# Patient Record
Sex: Male | Born: 1995 | Race: White | Hispanic: No | Marital: Single | Smoking: Never smoker
Health system: Southern US, Community
[De-identification: ages and names within clinical notes are randomized; demographics above are authoritative.]

## PROBLEM LIST (undated history)

## (undated) DIAGNOSIS — Z8619 Personal history of other infectious and parasitic diseases: Secondary | ICD-10-CM

## (undated) HISTORY — PX: NO PAST SURGERIES: SHX2092

## (undated) HISTORY — DX: Personal history of other infectious and parasitic diseases: Z86.19

---
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
## 2013-01-26 ENCOUNTER — Telehealth: Payer: Self-pay | Admitting: Internal Medicine

## 2013-01-26 NOTE — Telephone Encounter (Signed)
Patient Information:  Caller Name: Darl Pikes  Phone: [phone]  Patient: Paul Myers, Paul Myers  Gender: Male  DOB: [birth year]  Age: 17 Years  PCP: Sanda Linger (Adults only)  Office Follow Up:  Does the office need to follow up with this patient?: No  Instructions For The Office: N/A  RN Note:  Pt is away at school at [employer].  Mother thinks it could be related to stress or anxiety. Mother is wanting to know if patient needs to be seen while at school or if pt can wait to be seen when he comes home in October.  RN advised for pt to be seen where he is at school  Symptoms  Reason For Call & Symptoms: mother reports pt was seen in the office on 12/16/12 by Dr Posey Rea for heart palpitations.  Mother states the symptoms went away but they are starting to come back again.  Mother states pt is following advice from MD but symptoms are continuing.   Reviewed Health History In EMR: Yes  Reviewed Medications In EMR: Yes  Reviewed Allergies In EMR: Yes  Reviewed Surgeries / Procedures: Yes  Date of Onset of Symptoms: 01/21/2013  Guideline(s) Used:  Heart Rate and Heartbeat Questions  Disposition Per Guideline:   See Within 3 Days in Office  Reason For Disposition Reached:   Palpitations and no improvement after following Care Advice  Advice Given:  Health Basics  Sleep: Try to get sufficient amount of sleep. Lack of sleep can aggravate palpitations. Most people need 7-8 hours of sleep each night.  Exercise: Regular exercise will improve your overall health, improve your mood, and is a simple method to reduce stress.  Diet: Eat a balanced healthy diet.  Avoid Caffeine  Avoid caffeine-containing beverages (Reason: caffeine is a stimulant and can aggravate palpitations).  Avoid Diet Pills  : Do not use diet pills (Reason: they act as stimulants).  Limit Alcohol:  Limit your alcohol consumption to no more than 2 drinks a day. Ideally, eliminate alcohol entirely for the next 2 weeks.  Call Back  If:  Chest pain, lightheadedness, or difficulty breathing occurs  Heart beating more than 130 beats / minute  More than 3 extra or skipped beats / minute  You become worse.  Patient Will Follow Care Advice:  YES

## 2013-04-20 ENCOUNTER — Other Ambulatory Visit (INDEPENDENT_AMBULATORY_CARE_PROVIDER_SITE_OTHER): Payer: Managed Care, Other (non HMO)

## 2013-04-20 ENCOUNTER — Ambulatory Visit (INDEPENDENT_AMBULATORY_CARE_PROVIDER_SITE_OTHER): Payer: Managed Care, Other (non HMO) | Admitting: Internal Medicine

## 2013-04-20 ENCOUNTER — Encounter: Payer: Self-pay | Admitting: Internal Medicine

## 2013-04-20 VITALS — BP 118/82 | HR 68 | Temp 97.7°F | Wt 162.8 lb

## 2013-04-20 DIAGNOSIS — Z113 Encounter for screening for infections with a predominantly sexual mode of transmission: Secondary | ICD-10-CM

## 2013-04-20 DIAGNOSIS — N341 Nonspecific urethritis: Secondary | ICD-10-CM

## 2013-04-20 DIAGNOSIS — J069 Acute upper respiratory infection, unspecified: Secondary | ICD-10-CM

## 2013-04-20 LAB — URINALYSIS, ROUTINE W REFLEX MICROSCOPIC
Hgb urine dipstick: NEGATIVE
Leukocytes, UA: NEGATIVE
Nitrite: NEGATIVE
Total Protein, Urine: NEGATIVE
pH: 6.5 (ref 5.0–8.0)

## 2013-04-20 MED ORDER — AZITHROMYCIN 500 MG PO TABS: 1000.0000 mg | ORAL_TABLET | Freq: Once | ORAL | Status: DC

## 2013-04-20 NOTE — Progress Notes (Signed)
Pre-visit discussion using our clinic review tool. No additional management support is needed unless otherwise documented below in the visit note.  

## 2013-04-20 NOTE — Patient Instructions (Addendum)
It was good to see you today.  Test(s) ordered today. Your results will be released to MyChart (or called to you at [phone]) after review, usually within 72hours after test completion. If any changes need to be made, you will be notified at that same time.  antibiotics - Azithromycin - 2pills x 1 dose today - Your prescription(s) have been submitted to your pharmacy. Please take as directed and contact our office if you believe you are having problem(s) with the medication(s).   Urethritis, Adult Urethritis is an inflammation (soreness) of the urethra (the tube exiting from the bladder). It is often caused by germs that may be spread through sexual contact. TREATMENT  Urethritis will usually respond to antibiotics. These are medications that kill germs. Take all the medicine given to you. You may feel better in a couple days, but TAKE ALL MEDICINE or the infection may not be completely cured and may become more difficult to treat. Response can generally be expected in 7 to 10 days. You may require additional treatment after more testing. HOME CARE INSTRUCTIONS  Not have sex until the test results are known and treatment is completed.  Know that you may be asked to notify your sex partner when your final test results are back.  Finish all medications as prescribed.  Prevent sexually transmitted infections including AIDS. Practice safe sex. Use condoms. SEEK MEDICAL CARE IF:   Your symptoms are not improved in 2 to 3 days.  Your symptoms are getting worse.  Your develop abdominal pain.  You develop joint pain. SEEK IMMEDIATE MEDICAL CARE IF:   You have a fever.  You develop severe pain in the belly, back or side.  You develop repeated vomiting. TEST RESULTS Not all test results are available during your visit. If your test results are not back during the visit, make an appointment with your caregiver to find out the results. Do not assume everything is normal if you have not  heard from your caregiver or the medical facility. It is important for you to follow-up on all of your test results. Document Released: 10/17/2000 Document Revised: 07/16/2011 Document Reviewed: 05/09/2009 [facility] Patient Information 2014 [place], [place]. Safe Sex Safe sex is about reducing the risk of giving or getting a sexually transmitted disease (STD). STDs are spread through sexual contact involving the genitals, mouth, or rectum. Some STDS can be cured and others cannot. Safe sex can also prevent unintended pregnancies.  SAFE SEX PRACTICES  Limit your sexual activity to only one partner who is only having sex with you.  Talk to your partner about their past partners, past STDs, and drug use.  Use a condom every time you have sexual intercourse. This includes vaginal, oral, and anal sexual activity. Both females and males should wear condoms during oral sex. Only use latex or polyurethane condoms and water-based lubricants. Petroleum-based lubricants or oils used to lubricate a condom will weaken the condom and increase the chance that it will break. The condom should be in place from the beginning to the end of sexual activity. Wearing a condom reduces, but does not completely eliminate, your risk of getting or giving a STD. STDs can be spread by contact with skin of surrounding areas.  Get vaccinated for hepatitis B and HPV.  Avoid alcohol and recreational drugs which can affect your judgement. You may forget to use a condom or participate in high-risk sex.  For females, avoid douching after sexual intercourse. Douching can spread an infection farther into the  reproductive tract.  Check your body for signs of sores, blisters, rashes, or unusual discharge. See your caregiver if you notice any of these signs.  Avoid sexual contact if you have symptoms of an infection or are being treated for an STD. If you or your partner has herpes, avoid sexual contact when blisters are present. Use  condoms at all other times.  See your caregiver for regular screenings, examinations, and tests for STDs. Before having sex with a new partner, each of you should be screened for STDs and talk about the results with your partner. BENEFITS OF SAFE SEX   There is less of a chance of getting or giving an STD.  You can prevent unwanted or unintended pregnancies.  By discussing safer sex concerns with your partner, you may increase feelings of intimacy, comfort, trust, and honesty between the both of you. Document Released: 05/31/2004 Document Revised: 01/16/2012 Document Reviewed: 10/15/2011 [facility] Patient Information 2014 [place], [place].

## 2013-04-20 NOTE — Progress Notes (Signed)
   Subjective:    Patient ID: Paul Myers, male    DOB: Apr 05, 1996, 17 y.o.   MRN: [id number]  HPI  Several concerns following unprotected sexual encounter 10 days ago: urethral itch without discharge, sore throat, ear fullness and headache - concern for HIV or other STI  Past Medical History  Diagnosis Date  . History of chicken pox     Review of Systems  Constitutional: Negative for fever, chills, diaphoresis and fatigue.  HENT: Positive for postnasal drip, sinus pressure and sore throat. Negative for ear discharge, ear pain, mouth sores, trouble swallowing and voice change.   Genitourinary: Positive for dysuria. Negative for urgency, frequency, flank pain, decreased urine volume, discharge, penile swelling, scrotal swelling, difficulty urinating, genital sores, penile pain (but urethral "itch" x 1 week) and testicular pain.  Neurological: Positive for headaches.       Objective:   Physical Exam BP 118/82  Pulse 68  Temp(Src) 97.7 F (36.5 C) (Oral)  Wt 162 lb 12.8 oz (73.846 kg)  SpO2 97%  Constitutional: he appears well-developed and well-nourished. No distress.  HENT: Head: Normocephalic and atraumatic. Ears: B TMs ok, no erythema or effusion; Nose: Nose normal. Mouth/Throat: Mild erythema - Oropharynx is clear and moist. No oropharyngeal exudate.  Eyes: Conjunctivae and EOM are normal. Pupils are equal, round, and reactive to light. No scleral icterus.  Neck: Normal range of motion. Neck supple. No JVD present. No thyromegaly present.  Cardiovascular: Normal rate, regular rhythm and normal heart sounds.  No murmur heard. No BLE edema. Pulmonary/Chest: Effort normal and breath sounds normal. No respiratory distress. he has no wheezes.  Abdominal: Soft. Bowel sounds are normal. he exhibits no distension. There is no tenderness. no masses GU: defer at pt request Skin: Skin is warm and dry. No rash noted. No erythema.  Psychiatric: he has a normal mood and affect.  behavior is normal. Judgment and thought content normal.      Assessment & Plan:    Viral syndrome with pharyngitis and postnasal drip Urethritis following unprotected sex 10 days ago - history of same May 2014 reviewed (no STI at that time)   Check urinalysis for GC and Chlamydia Blood test for HIV, patient to repeat same in 3-6 months  Education on necessity for protected sexual intercourse  Empiric treatment for possible STI with azithromycin 1 g given cephalosporin allergy  Consider urologic evaluation if recurrent urethritis events without infection identified

## 2013-09-14 ENCOUNTER — Encounter: Payer: Self-pay | Admitting: Internal Medicine

## 2013-09-14 ENCOUNTER — Ambulatory Visit (INDEPENDENT_AMBULATORY_CARE_PROVIDER_SITE_OTHER): Payer: Managed Care, Other (non HMO) | Admitting: Internal Medicine

## 2013-09-14 ENCOUNTER — Telehealth: Payer: Self-pay | Admitting: *Deleted

## 2013-09-14 ENCOUNTER — Other Ambulatory Visit (INDEPENDENT_AMBULATORY_CARE_PROVIDER_SITE_OTHER): Payer: Managed Care, Other (non HMO)

## 2013-09-14 VITALS — BP 118/78 | HR 96 | Temp 99.1°F | Resp 16 | Ht 72.0 in | Wt 179.5 lb

## 2013-09-14 DIAGNOSIS — R300 Dysuria: Secondary | ICD-10-CM

## 2013-09-14 DIAGNOSIS — N342 Other urethritis: Secondary | ICD-10-CM

## 2013-09-14 DIAGNOSIS — N411 Chronic prostatitis: Secondary | ICD-10-CM

## 2013-09-14 DIAGNOSIS — L709 Acne, unspecified: Secondary | ICD-10-CM

## 2013-09-14 LAB — URINALYSIS, ROUTINE W REFLEX MICROSCOPIC
Bilirubin Urine: NEGATIVE
KETONES UR: NEGATIVE
Nitrite: NEGATIVE
PH: 6 (ref 5.0–8.0)
Urine Glucose: NEGATIVE
Urobilinogen, UA: 0.2 (ref 0.0–1.0)

## 2013-09-14 LAB — CBC WITH DIFFERENTIAL/PLATELET
BASOS ABS: 0 10*3/uL (ref 0.0–0.1)
Basophils Relative: 0.4 % (ref 0.0–3.0)
Eosinophils Absolute: 0.2 10*3/uL (ref 0.0–0.7)
Eosinophils Relative: 3 % (ref 0.0–5.0)
HCT: 48.8 % (ref 39.0–52.0)
HEMOGLOBIN: 16.8 g/dL (ref 13.0–17.0)
LYMPHS ABS: 2 10*3/uL (ref 0.7–4.0)
Lymphocytes Relative: 30.1 % (ref 12.0–46.0)
MCHC: 34.4 g/dL (ref 30.0–36.0)
MCV: 88.5 fl (ref 78.0–100.0)
Monocytes Absolute: 0.8 10*3/uL (ref 0.1–1.0)
Monocytes Relative: 11.6 % (ref 3.0–12.0)
NEUTROS ABS: 3.6 10*3/uL (ref 1.4–7.7)
Neutrophils Relative %: 54.9 % (ref 43.0–77.0)
Platelets: 245 10*3/uL (ref 150.0–400.0)
RBC: 5.52 Mil/uL (ref 4.22–5.81)
RDW: 12.4 % (ref 11.5–14.6)
WBC: 6.5 10*3/uL (ref 4.5–10.5)

## 2013-09-14 MED ORDER — DOXYCYCLINE HYCLATE 100 MG PO TBEC: 100.0000 mg | DELAYED_RELEASE_TABLET | Freq: Two times a day (BID) | ORAL | Status: DC

## 2013-09-14 MED ORDER — DOXYCYCLINE HYCLATE 100 MG PO CAPS: 100.0000 mg | ORAL_CAPSULE | Freq: Two times a day (BID) | ORAL | Status: AC

## 2013-09-14 MED ORDER — BENZOYL PEROXIDE 5 % EX BAR: CHEWABLE_BAR | Freq: Every day | CUTANEOUS | Status: AC

## 2013-09-14 NOTE — Telephone Encounter (Signed)
Pt called states Doxycycline is too expensive.  Please advise

## 2013-09-14 NOTE — Assessment & Plan Note (Signed)
I am concerned that he may have IC, urethritis, etc Will check his UA and will screen for STD's

## 2013-09-14 NOTE — Progress Notes (Signed)
Subjective:    Patient ID: Paul Myers, male    DOB: [birth year], 18 y.o.   MRN: [id number]  Dysuria  This is a recurrent problem. The current episode started more than 1 year ago. The problem occurs intermittently. The problem has been gradually worsening. The quality of the pain is described as burning. The patient is experiencing no pain. There has been no fever. The fever has been present for less than 1 day. He is sexually active. There is no history of pyelonephritis. Pertinent negatives include no chills, discharge, flank pain, frequency, hematuria, hesitancy, nausea, sweats, urgency or vomiting. He has tried antibiotics (zpak) for the symptoms. The treatment provided mild relief.      Review of Systems  Constitutional: Negative.  Negative for fever, chills, diaphoresis, appetite change and fatigue.  HENT: Negative.   Eyes: Negative.   Respiratory: Negative.  Negative for cough, choking, chest tightness, shortness of breath, wheezing and stridor.   Cardiovascular: Negative.  Negative for chest pain, palpitations and leg swelling.  Gastrointestinal: Negative.  Negative for nausea, vomiting, abdominal pain and diarrhea.  Endocrine: Negative.   Genitourinary: Positive for dysuria and penile pain. Negative for hesitancy, urgency, frequency, hematuria, flank pain, decreased urine volume, discharge, penile swelling, scrotal swelling, enuresis, difficulty urinating, genital sores and testicular pain.  Musculoskeletal: Negative.   Skin: Negative.   Allergic/Immunologic: Negative.   Neurological: Negative.   Hematological: Negative.  Negative for adenopathy. Does not bruise/bleed easily.  Psychiatric/Behavioral: Negative.        Objective:   Physical Exam  Vitals reviewed. Constitutional: He is oriented to person, place, and time. He appears well-developed and well-nourished. No distress.  HENT:  Head: Normocephalic and atraumatic.  Mouth/Throat: Oropharynx is clear and  moist. No oropharyngeal exudate.  Eyes: Conjunctivae are normal. Right eye exhibits no discharge. Left eye exhibits no discharge. No scleral icterus.  Neck: Normal range of motion. Neck supple. No JVD present. No tracheal deviation present. No thyromegaly present.  Cardiovascular: Normal rate, regular rhythm, normal heart sounds and intact distal pulses.  Exam reveals no gallop.   No murmur heard. Pulmonary/Chest: Effort normal and breath sounds normal. No stridor. No respiratory distress. He has no wheezes. He has no rales. He exhibits no tenderness.  Abdominal: Soft. Bowel sounds are normal. He exhibits no distension and no mass. There is no tenderness. There is no rebound and no guarding. Hernia confirmed negative in the right inguinal area and confirmed negative in the left inguinal area.  Genitourinary: Rectum normal, testes normal and penis normal. Rectal exam shows no external hemorrhoid, no internal hemorrhoid, no fissure, no mass, no tenderness and anal tone normal. Guaiac negative stool. Prostate is enlarged (left lobe is slightly enlarged and boggy) and tender. Right testis shows no mass, no swelling and no tenderness. Right testis is descended. Left testis shows no mass, no swelling and no tenderness. Left testis is descended. Circumcised. No penile erythema or penile tenderness. No discharge found.  Musculoskeletal: Normal range of motion. He exhibits no edema and no tenderness.  Lymphadenopathy:    He has no cervical adenopathy.       Right: No inguinal adenopathy present.       Left: No inguinal adenopathy present.  Neurological: He is oriented to person, place, and time.  Skin: Skin is warm and dry. No rash noted. He is not diaphoretic. No erythema. No pallor.  Psychiatric: He has a normal mood and affect. His behavior is normal. Judgment and thought content normal.  Lab Results  Component Value Date   WBC 6.5 09/14/2013   HGB 16.8 09/14/2013   HCT 48.8 09/14/2013   PLT  245.0 09/14/2013   GLUCOSE 97 12/16/2012   CHOL 171 09/25/2012   TRIG 388.0* 09/25/2012   HDL 29.00* 09/25/2012   LDLDIRECT 104.7 09/25/2012   ALT 17 12/16/2012   AST 20 12/16/2012   NA 138 12/16/2012   K 4.3 12/16/2012   CL 101 12/16/2012   CREATININE 0.9 12/16/2012   BUN 13 12/16/2012   CO2 28 12/16/2012   TSH 1.54 12/16/2012      Assessment & Plan:

## 2013-09-14 NOTE — Patient Instructions (Signed)
Prostatitis The prostate gland is about the size and shape of a walnut. It is located just below your bladder. It produces one of the components of semen, which is made up of sperm and the fluids that help nourish and transport it out from the testicles. Prostatitis is inflammation of the prostate gland.  There are four types of prostatitis:  Acute bacterial prostatitis This is the least common type of prostatitis. It starts quickly and usually is associated with a bladder infection, high fever, and shaking chills. It can occur at any age.  Chronic bacterial prostatitis This is a persistent bacterial infection in the prostate. It usually develops from repeated acute bacterial prostatitis or acute bacterial prostatitis that was not properly treated. It can occur in men of any age but is most common in middle-aged men whose prostate has begun to enlarge. The symptoms are not as severe as those in acute bacterial prostatitis. Discomfort in the part of your body that is in front of your rectum and below your scrotum (perineum), lower abdomen, or in the head of your penis (glans) may represent your primary discomfort.  Chronic prostatitis (nonbacterial) This is the most common type of prostatitis. It is inflammation of the prostate gland that is not caused by a bacterial infection. The cause is unknown and may be associated with a viral infection or autoimmune disorder.  Prostatodynia (pelvic floor disorder) This is associated with increased muscular tone in the pelvis surrounding the prostate. CAUSES The causes of bacterial prostatitis are bacterial infection. The causes of the other types of prostatitis are unknown.  SYMPTOMS  Symptoms can vary depending upon the type of prostatitis that exists. There can also be overlap in symptoms. Possible symptoms for each type of prostatitis are listed below. Acute Bacterial Prostatitis  Painful urination.  Fever or chills.  Muscle or joint pains.  Low  back pain.  Low abdominal pain.  Inability to empty bladder completely. Chronic Bacterial Prostatitis, Chronic Nonbacterial Prostatitis, and Prostatodynia  Sudden urge to urinate.  Frequent urination.  Difficulty starting urine stream.  Weak urine stream.  Discharge from the urethra.  Dribbling after urination.  Rectal pain.  Pain in the testicles, penis, or tip of the penis.  Pain in the perineum.  Problems with sexual function.  Painful ejaculation.  Bloody semen. DIAGNOSIS  In order to diagnose prostatitis, your health care provider will ask about your symptoms. One or more urine samples will be taken and tested (urinalysis). If the urinalysis result is negative for bacteria, your health care provider may use a finger to feel your prostate (digital rectal exam). This exam helps your health care provider determine if your prostate is swollen and tender. It will also produce a specimen of semen that can be analyzed. TREATMENT  Treatment for prostatitis depends on the cause. If a bacterial infection is the cause, it can be treated with antibiotic medicine. In cases of chronic bacterial prostatitis, the use of antibiotics for up to 1 month or 6 weeks may be necessary. Your health care provider may instruct you to take sitz baths to help relieve pain. A sitz bath is a bath of hot water in which your hips and buttocks are under water. This relaxes the pelvic floor muscles and often helps to relieve the pressure on your prostate. HOME CARE INSTRUCTIONS   Take all medicines as directed by your health care provider.  Take sitz baths as directed by your health care provider. SEEK MEDICAL CARE IF:   Your symptoms   get worse, not better.  You have a fever. SEEK IMMEDIATE MEDICAL CARE IF:   You have chills.  You feel nauseous or vomit.  You feel lightheaded or faint.  You are unable to urinate.  You have blood or blood clots in your urine. Document Released: 04/20/2000  Document Revised: 02/11/2013 Document Reviewed: 11/10/2012 [employer] Patient Information 2014 [employer].  

## 2013-09-14 NOTE — Progress Notes (Signed)
Pre visit review using our clinic review tool, if applicable. No additional management support is needed unless otherwise documented below in the visit note. 

## 2013-09-14 NOTE — Assessment & Plan Note (Signed)
Will treat with doxycycline for 30 days

## 2013-09-14 NOTE — Assessment & Plan Note (Signed)
Will treat with doxy I have ordered labs to check for infectious causes as well

## 2013-09-15 ENCOUNTER — Telehealth: Payer: Self-pay | Admitting: *Deleted

## 2013-09-15 ENCOUNTER — Encounter: Payer: Self-pay | Admitting: Internal Medicine

## 2013-09-15 LAB — HIV ANTIBODY (ROUTINE TESTING W REFLEX): HIV 1&2 Ab, 4th Generation: NONREACTIVE

## 2013-09-15 LAB — GC/CHLAMYDIA PROBE AMP, URINE
Chlamydia, Swab/Urine, PCR: POSITIVE — AB
GC PROBE AMP, URINE: NEGATIVE

## 2013-09-15 LAB — RPR

## 2013-09-15 NOTE — Telephone Encounter (Signed)
Why?  

## 2013-09-15 NOTE — Telephone Encounter (Signed)
Pt states he discussed the need for the Rx at his appoint on yesterday.  Please advise

## 2013-09-15 NOTE — Telephone Encounter (Signed)
Pt called states he was to believe he was to have a Rx for Triamcinalone sent to [facility] on [address].  Please advise

## 2013-09-16 ENCOUNTER — Other Ambulatory Visit: Payer: Self-pay | Admitting: Internal Medicine

## 2013-09-16 LAB — CULTURE, URINE COMPREHENSIVE
COLONY COUNT: NO GROWTH
Organism ID, Bacteria: NO GROWTH

## 2013-09-16 MED ORDER — TRIAMCINOLONE ACETONIDE 0.5 % EX CREA: 1.0000 "application " | TOPICAL_CREAM | Freq: Three times a day (TID) | CUTANEOUS | Status: DC

## 2013-09-16 NOTE — Telephone Encounter (Signed)
I believe we talked about acne so a steroid cream dose not make sense to me

## 2013-09-16 NOTE — Telephone Encounter (Signed)
Left message with pts mother for pt to contact [facility].

## 2013-11-16 ENCOUNTER — Telehealth: Payer: Self-pay | Admitting: Internal Medicine

## 2013-11-16 NOTE — Telephone Encounter (Signed)
Message copied by Julieanne MansonMUNFORD, ADERO J on Mon Nov 16, 2013  9:46 AM ------      Message from: Marshell GarfinkelAMPBELL, BRITTANY P      Created: Wed Nov 11, 2013 11:55 AM      Regarding: FW: New Patients      Contact: [phone]                   ----- Message -----         From: Etta Grandchildhomas L Jones, MD         Sent: 11/11/2013   9:11 AM           To: Marshell GarfinkelBrittany P Campbell      Subject: RE: New Patients                                         I have said yes to this. All of them. YES!            Leitha Schuller. Jones            ----- Message -----         From: Marshell GarfinkelBrittany P Campbell         Sent: 11/11/2013   8:51 AM           To: Etta Grandchildhomas L Jones, MD, Adero Costella HatcherJ Munford      Subject: New Patients                                             Dr. Yetta BarreJones,      This patient's mother called stating that you told the patient to have his family call for new patient appointments. I've been told you are not accepting new patients at this time. She is requesting that you see her husband, her youngest son and herself. Please advise and route back to Adero. Thanks!             ------

## 2013-11-16 NOTE — Telephone Encounter (Signed)
Message copied by Julieanne MansonMUNFORD, ADERO J on Mon Nov 16, 2013  9:45 AM ------      Message from: Arvilla MeresAMPBELL, BRITTANY P      Created: Wed Nov 11, 2013  8:51 AM      Regarding: New Patients      Contact: [phone]       Dr. Yetta BarreJones,      This patient's mother called stating that you told the patient to have his family call for new patient appointments. I've been told you are not accepting new patients at this time. She is requesting that you see her husband, her youngest son and herself. Please advise and route back to Adero. Thanks!       ------

## 2013-11-16 NOTE — Telephone Encounter (Signed)
Dr. Yetta BarreJones has agreed to see this patient and family. Called and LVM for scheduling.

## 2014-04-26 ENCOUNTER — Encounter: Payer: Self-pay | Admitting: Internal Medicine

## 2014-04-26 ENCOUNTER — Other Ambulatory Visit (INDEPENDENT_AMBULATORY_CARE_PROVIDER_SITE_OTHER): Payer: Managed Care, Other (non HMO)

## 2014-04-26 ENCOUNTER — Ambulatory Visit (INDEPENDENT_AMBULATORY_CARE_PROVIDER_SITE_OTHER): Payer: Managed Care, Other (non HMO) | Admitting: Internal Medicine

## 2014-04-26 VITALS — BP 120/84 | HR 96 | Temp 98.0°F | Resp 16 | Ht 72.0 in | Wt 176.0 lb

## 2014-04-26 DIAGNOSIS — N342 Other urethritis: Secondary | ICD-10-CM

## 2014-04-26 DIAGNOSIS — R21 Rash and other nonspecific skin eruption: Secondary | ICD-10-CM

## 2014-04-26 LAB — URINALYSIS, ROUTINE W REFLEX MICROSCOPIC
Bilirubin Urine: NEGATIVE
Hgb urine dipstick: NEGATIVE
Ketones, ur: NEGATIVE
Leukocytes, UA: NEGATIVE
NITRITE: NEGATIVE
PH: 6 (ref 5.0–8.0)
RBC / HPF: NONE SEEN (ref 0–?)
Specific Gravity, Urine: 1.025 (ref 1.000–1.030)
Total Protein, Urine: NEGATIVE
URINE GLUCOSE: NEGATIVE
Urobilinogen, UA: 0.2 (ref 0.0–1.0)
WBC UA: NONE SEEN (ref 0–?)

## 2014-04-26 NOTE — Patient Instructions (Signed)

## 2014-04-26 NOTE — Progress Notes (Signed)
Subjective:    Patient ID: Paul Myers, male    DOB: [birth year], 18 y.o.   MRN: [id number]  Rash This is a recurrent problem. The current episode started 1 to 4 weeks ago. The affected locations include the groin. The rash is characterized by dryness and itchiness (occasional burning). He was exposed to nothing. Pertinent negatives include no anorexia, congestion, cough, diarrhea, eye pain, facial edema, fatigue, fever, joint pain, nail changes, rhinorrhea, shortness of breath, sore throat or vomiting. Past treatments include nothing. The treatment provided no relief.      Review of Systems  Constitutional: Negative.  Negative for fever, chills, diaphoresis, appetite change and fatigue.  HENT: Negative.  Negative for congestion, rhinorrhea and sore throat.   Eyes: Negative.  Negative for pain.  Respiratory: Negative.  Negative for cough and shortness of breath.   Cardiovascular: Negative.   Gastrointestinal: Negative.  Negative for vomiting, abdominal pain, diarrhea, constipation, blood in stool and anorexia.  Endocrine: Negative.   Genitourinary: Negative.  Negative for dysuria, urgency, frequency, hematuria, flank pain, decreased urine volume, discharge, penile swelling, scrotal swelling, enuresis, difficulty urinating, genital sores, penile pain and testicular pain.  Musculoskeletal: Negative.  Negative for joint pain.  Skin: Positive for rash. Negative for nail changes.  Allergic/Immunologic: Negative.   Neurological: Negative.   Hematological: Negative.  Negative for adenopathy. Does not bruise/bleed easily.  Psychiatric/Behavioral: Negative.        Objective:   Physical Exam  Constitutional: He is oriented to person, place, and time. He appears well-developed and well-nourished. No distress.  HENT:  Head: Normocephalic and atraumatic.  Mouth/Throat: Oropharynx is clear and moist. No oropharyngeal exudate.  Eyes: Conjunctivae are normal. Right eye exhibits no  discharge. Left eye exhibits no discharge. No scleral icterus.  Neck: Normal range of motion. Neck supple. No JVD present. No tracheal deviation present. No thyromegaly present.  Cardiovascular: Normal rate, regular rhythm, normal heart sounds and intact distal pulses.  Exam reveals no gallop and no friction rub.   No murmur heard. Pulmonary/Chest: Effort normal and breath sounds normal. No stridor. No respiratory distress. He has no wheezes. He has no rales. He exhibits no tenderness.  Abdominal: Soft. Bowel sounds are normal. He exhibits no distension and no mass. There is no tenderness. There is no rebound and no guarding. Hernia confirmed negative in the right inguinal area and confirmed negative in the left inguinal area.  Genitourinary: Testes normal and penis normal. Right testis shows no mass, no swelling and no tenderness. Right testis is descended. Left testis shows no mass, no swelling and no tenderness. Left testis is descended. Circumcised. No penile erythema or penile tenderness. No discharge found.  Musculoskeletal: Normal range of motion. He exhibits no edema or tenderness.  Lymphadenopathy:    He has no cervical adenopathy.       Right: No inguinal adenopathy present.       Left: No inguinal adenopathy present.  Neurological: He is oriented to person, place, and time.  Skin: Skin is warm and dry. No rash noted. He is not diaphoretic. No erythema. No pallor.     Lab Results  Component Value Date   WBC 6.5 09/14/2013   HGB 16.8 09/14/2013   HCT 48.8 09/14/2013   PLT 245.0 09/14/2013   GLUCOSE 97 12/16/2012   CHOL 171 09/25/2012   TRIG 388.0* 09/25/2012   HDL 29.00* 09/25/2012   LDLDIRECT 104.7 09/25/2012   ALT 17 12/16/2012   AST 20 12/16/2012   NA 138  12/16/2012   K 4.3 12/16/2012   CL 101 12/16/2012   CREATININE 0.9 12/16/2012   BUN 13 12/16/2012   CO2 28 12/16/2012   TSH 1.54 12/16/2012       Assessment & Plan:

## 2014-04-27 ENCOUNTER — Encounter: Payer: Self-pay | Admitting: Internal Medicine

## 2014-04-27 LAB — HSV(HERPES SMPLX)ABS-I+II(IGG+IGM)-BLD: Herpes Simplex Vrs I&II-IgM Ab (EIA): 0.3 INDEX

## 2014-04-27 NOTE — Assessment & Plan Note (Signed)
The exam is normal today Some of his symptoms were concerning for HSV for he is neg for that No treatment needed at this time

## 2014-04-27 NOTE — Assessment & Plan Note (Signed)
The exam and UA are normal now

## 2014-07-12 ENCOUNTER — Ambulatory Visit (INDEPENDENT_AMBULATORY_CARE_PROVIDER_SITE_OTHER)
Admission: RE | Admit: 2014-07-12 | Discharge: 2014-07-12 | Disposition: A | Discharge status: Home / Self Care | Payer: No Typology Code available for payment source | Source: Ambulatory Visit | Attending: Internal Medicine | Admitting: Internal Medicine

## 2014-07-12 ENCOUNTER — Encounter: Payer: Self-pay | Admitting: Internal Medicine

## 2014-07-12 ENCOUNTER — Ambulatory Visit (INDEPENDENT_AMBULATORY_CARE_PROVIDER_SITE_OTHER): Payer: No Typology Code available for payment source | Admitting: Internal Medicine

## 2014-07-12 VITALS — BP 120/80 | HR 84 | Temp 98.3°F | Resp 16 | Ht 72.0 in | Wt 187.0 lb

## 2014-07-12 DIAGNOSIS — S62614A Displaced fracture of proximal phalanx of right ring finger, initial encounter for closed fracture: Secondary | ICD-10-CM | POA: Insufficient documentation

## 2014-07-12 DIAGNOSIS — S62614S Displaced fracture of proximal phalanx of right ring finger, sequela: Secondary | ICD-10-CM

## 2014-07-12 NOTE — Assessment & Plan Note (Signed)
He will remain in the splint I have asked him to see a hand surgeon to consider repair of the fracture, tendon, etc

## 2014-07-12 NOTE — Progress Notes (Signed)
Pre visit review using our clinic review tool, if applicable. No additional management support is needed unless otherwise documented below in the visit note. 

## 2014-07-12 NOTE — Patient Instructions (Signed)

## 2014-07-12 NOTE — Progress Notes (Signed)
   Subjective:    Patient ID: Paul Myers, male    DOB: 1995-10-24, 19 y.o.   MRN: [id number]  HPI Comments: He hurt his rt ring finger about 2 weeks ago [facility] [place]ulsion [place] over the PIP joint. The finger has been splinted but he complains of persistent pain/swelling/decreased ROM in the PIP joint.  Hand Pain       Review of Systems  All other systems reviewed and are negative.      Objective:   Physical Exam  Musculoskeletal:       Hands:         Assessment & Plan:

## 2015-04-20 ENCOUNTER — Encounter: Payer: Self-pay | Admitting: Internal Medicine

## 2015-04-21 ENCOUNTER — Other Ambulatory Visit: Payer: Self-pay | Admitting: Internal Medicine

## 2015-04-21 MED ORDER — ALUMINUM CHLORIDE 20 % EX SOLN: Freq: Two times a day (BID) | CUTANEOUS | Status: DC

## 2015-09-19 ENCOUNTER — Telehealth: Payer: Self-pay | Admitting: Internal Medicine

## 2015-09-19 NOTE — Telephone Encounter (Signed)
Yes, whatever works for him

## 2015-09-19 NOTE — Telephone Encounter (Signed)
Pt was wondering if Dr. Yetta BarreJones can work him in for a physical next week. He has to have it done before 10/04/15 and take the paper work with him before he leave [place]. Please advise.

## 2015-09-29 ENCOUNTER — Other Ambulatory Visit (INDEPENDENT_AMBULATORY_CARE_PROVIDER_SITE_OTHER): Payer: BLUE CROSS/BLUE SHIELD

## 2015-09-29 ENCOUNTER — Ambulatory Visit (INDEPENDENT_AMBULATORY_CARE_PROVIDER_SITE_OTHER): Payer: BLUE CROSS/BLUE SHIELD | Admitting: Internal Medicine

## 2015-09-29 ENCOUNTER — Encounter: Payer: Self-pay | Admitting: Internal Medicine

## 2015-09-29 VITALS — BP 126/80 | HR 79 | Temp 98.2°F | Resp 16 | Ht 72.0 in | Wt 177.0 lb

## 2015-09-29 DIAGNOSIS — L74510 Primary focal hyperhidrosis, axilla: Secondary | ICD-10-CM | POA: Insufficient documentation

## 2015-09-29 DIAGNOSIS — Z202 Contact with and (suspected) exposure to infections with a predominantly sexual mode of transmission: Secondary | ICD-10-CM

## 2015-09-29 DIAGNOSIS — R7989 Other specified abnormal findings of blood chemistry: Secondary | ICD-10-CM

## 2015-09-29 DIAGNOSIS — Z0000 Encounter for general adult medical examination without abnormal findings: Secondary | ICD-10-CM

## 2015-09-29 DIAGNOSIS — K130 Diseases of lips: Secondary | ICD-10-CM | POA: Insufficient documentation

## 2015-09-29 LAB — URINALYSIS, ROUTINE W REFLEX MICROSCOPIC
BILIRUBIN URINE: NEGATIVE
HGB URINE DIPSTICK: NEGATIVE
Ketones, ur: NEGATIVE
Leukocytes, UA: NEGATIVE
NITRITE: NEGATIVE
PH: 6 (ref 5.0–8.0)
RBC / HPF: NONE SEEN (ref 0–?)
Specific Gravity, Urine: 1.02 (ref 1.000–1.030)
Total Protein, Urine: NEGATIVE
Urine Glucose: NEGATIVE
Urobilinogen, UA: 0.2 (ref 0.0–1.0)
WBC, UA: NONE SEEN (ref 0–?)

## 2015-09-29 LAB — LDL CHOLESTEROL, DIRECT: Direct LDL: 146 mg/dL

## 2015-09-29 LAB — COMPREHENSIVE METABOLIC PANEL
ALT: 20 U/L (ref 0–53)
AST: 20 U/L (ref 0–37)
Albumin: 5.3 g/dL — ABNORMAL HIGH (ref 3.5–5.2)
Alkaline Phosphatase: 50 U/L (ref 39–117)
BUN: 19 mg/dL (ref 6–23)
CO2: 30 mEq/L (ref 19–32)
Calcium: 10.3 mg/dL (ref 8.4–10.5)
Chloride: 101 mEq/L (ref 96–112)
Creatinine, Ser: 1.04 mg/dL (ref 0.40–1.50)
GFR: 94.6 mL/min (ref >60.00)
GLUCOSE: 115 mg/dL — AB (ref 70–99)
Potassium: 3.9 mEq/L (ref 3.5–5.1)
SODIUM: 140 meq/L (ref 135–145)
TOTAL PROTEIN: 7.6 g/dL (ref 6.0–8.3)
Total Bilirubin: 1.1 mg/dL (ref 0.2–1.2)

## 2015-09-29 LAB — CBC WITH DIFFERENTIAL/PLATELET
BASOS ABS: 0 10*3/uL (ref 0.0–0.1)
Basophils Relative: 0.4 % (ref 0.0–3.0)
Eosinophils Absolute: 0.1 10*3/uL (ref 0.0–0.7)
Eosinophils Relative: 1.1 % (ref 0.0–5.0)
HCT: 49.2 % (ref 39.0–52.0)
HEMOGLOBIN: 16.8 g/dL (ref 13.0–17.0)
LYMPHS ABS: 2.4 10*3/uL (ref 0.7–4.0)
Lymphocytes Relative: 34.2 % (ref 12.0–46.0)
MCHC: 34.2 g/dL (ref 30.0–36.0)
MCV: 86.8 fl (ref 78.0–100.0)
MONOS PCT: 9.9 % (ref 3.0–12.0)
Monocytes Absolute: 0.7 10*3/uL (ref 0.1–1.0)
NEUTROS PCT: 54.4 % (ref 43.0–77.0)
Neutro Abs: 3.9 10*3/uL (ref 1.4–7.7)
Platelets: 253 10*3/uL (ref 150.0–400.0)
RBC: 5.67 Mil/uL (ref 4.22–5.81)
RDW: 12.3 % (ref 11.5–15.5)
WBC: 7.1 10*3/uL (ref 4.0–10.5)

## 2015-09-29 LAB — LIPID PANEL
CHOL/HDL RATIO: 6
Cholesterol: 213 mg/dL — ABNORMAL HIGH (ref 0–200)
HDL: 33 mg/dL — AB (ref >39.00)
NONHDL: 180.05
TRIGLYCERIDES: 220 mg/dL — AB (ref 0.0–149.0)
VLDL: 44 mg/dL — ABNORMAL HIGH (ref 0.0–40.0)

## 2015-09-29 LAB — TSH: TSH: 1.42 u[IU]/mL (ref 0.35–4.50)

## 2015-09-29 MED ORDER — TRIAMCINOLONE ACETONIDE 0.5 % EX CREA: 1.0000 "application " | TOPICAL_CREAM | Freq: Three times a day (TID) | CUTANEOUS | Status: AC

## 2015-09-29 MED ORDER — ALUMINUM CHLORIDE 20 % EX SOLN: Freq: Two times a day (BID) | CUTANEOUS | Status: AC

## 2015-09-29 NOTE — Patient Instructions (Signed)

## 2015-09-29 NOTE — Progress Notes (Signed)
Pre visit review using our clinic review tool, if applicable. No additional management support is needed unless otherwise documented below in the visit note. 

## 2015-09-29 NOTE — Progress Notes (Signed)
Subjective:  Patient ID: Paul Myers, male    DOB: Oct 04, 1995  Age: 20 y.o. MRN: [id number]  CC: Annual Exam   HPI Paul Myers presents for a CPX.  He needs refills on topical medications for acne, hyperhidrosis, and angular cheilitis. He tells me that all of his skin symptoms are well controlled with the use of these medications. He otherwise feels well and offers no complaints. He requests testing for STI's. He admits to multiple sexual partners and does not always adhere to safe sexual practices.     Past Medical History  Diagnosis Date  . History of chicken pox    Past Surgical History  Procedure Laterality Date  . No past surgeries      reports that he has never smoked. He has never used smokeless tobacco. He reports that he drinks about 1.2 oz of alcohol per week. He reports that he does not use illicit drugs. family history includes Breast cancer in his maternal grandfather and paternal grandfather; Cancer in his mother; Diabetes in his paternal grandfather; Hypertension in his paternal grandfather. There is no history of Early death or Stroke. Allergies  Allergen Reactions  . Cefzil [Cefprozil]     Outpatient Prescriptions Prior to Visit  Medication Sig Dispense Refill  . benzoyl peroxide (PANOXYL) 5 % bar Apply topically at bedtime. 1 each 12  . aluminum chloride (DRYSOL) 20 % external solution Apply topically 2 (two) times daily. As needed for excessive perspiration 60 mL 11   No facility-administered medications prior to visit.    ROS Review of Systems  Constitutional: Negative.  Negative for fever, chills and fatigue.  HENT: Negative.  Negative for sore throat and trouble swallowing.   Eyes: Negative.  Negative for visual disturbance.  Respiratory: Negative.  Negative for cough, choking, chest tightness, shortness of breath and stridor.   Cardiovascular: Negative.  Negative for chest pain, palpitations and leg swelling.  Gastrointestinal:  Negative.  Negative for nausea, vomiting, abdominal pain, diarrhea, constipation and blood in stool.  Endocrine: Negative.   Genitourinary: Negative.  Negative for dysuria, urgency, hematuria, discharge, scrotal swelling, enuresis, difficulty urinating, genital sores, penile pain and testicular pain.  Musculoskeletal: Negative.   Skin: Negative.  Negative for rash.  Allergic/Immunologic: Negative.   Neurological: Negative.   Hematological: Negative.  Negative for adenopathy. Does not bruise/bleed easily.  Psychiatric/Behavioral: Negative.     Objective:  BP 126/80 mmHg  Pulse 79  Temp(Src) 98.2 F (36.8 C) (Oral)  Resp 16  Ht 6' (1.829 m)  Wt 177 lb (80.287 kg)  BMI 24.00 kg/m2  SpO2 95%  BP Readings from Last 3 Encounters:  09/29/15 126/80  07/12/14 120/80  04/26/14 120/84    Wt Readings from Last 3 Encounters:  09/29/15 177 lb (80.287 kg)  07/12/14 187 lb (84.823 kg)  04/26/14 176 lb (79.833 kg)    Physical Exam  Constitutional: He is oriented to person, place, and time. No distress.  HENT:  Mouth/Throat: Oropharynx is clear and moist. No oropharyngeal exudate.  Eyes: Conjunctivae are normal. Right eye exhibits no discharge. Left eye exhibits no discharge. No scleral icterus.  Neck: Normal range of motion. Neck supple. No JVD present. No tracheal deviation present. No thyromegaly present.  Cardiovascular: Normal rate, regular rhythm, normal heart sounds and intact distal pulses.  Exam reveals no gallop and no friction rub.   No murmur heard. Pulmonary/Chest: Effort normal and breath sounds normal. No stridor. No respiratory distress. He has no wheezes. He has no  rales. He exhibits no tenderness.  Abdominal: Soft. Bowel sounds are normal. He exhibits no distension and no mass. There is no tenderness. There is no rebound and no guarding. Hernia confirmed negative in the right inguinal area and confirmed negative in the left inguinal area.  Genitourinary: Testes normal.  Right testis shows no mass, no swelling and no tenderness. Right testis is descended. Left testis shows no mass, no swelling and no tenderness. Left testis is descended. Circumcised. No penile erythema or penile tenderness. No discharge found.  Musculoskeletal: Normal range of motion. He exhibits no edema or tenderness.  Lymphadenopathy:    He has no cervical adenopathy.       Right: No inguinal adenopathy present.       Left: No inguinal adenopathy present.  Neurological: He is oriented to person, place, and time.  Skin: Skin is warm and dry. No rash noted. He is not diaphoretic. No erythema. No pallor.  Vitals reviewed.   Lab Results  Component Value Date   WBC 7.1 09/29/2015   HGB 16.8 09/29/2015   HCT 49.2 09/29/2015   PLT 253.0 09/29/2015   GLUCOSE 115* 09/29/2015   CHOL 213* 09/29/2015   TRIG 220.0* 09/29/2015   HDL 33.00* 09/29/2015   LDLDIRECT 146.0 09/29/2015   ALT 20 09/29/2015   AST 20 09/29/2015   NA 140 09/29/2015   K 3.9 09/29/2015   CL 101 09/29/2015   CREATININE 1.04 09/29/2015   BUN 19 09/29/2015   CO2 30 09/29/2015   TSH 1.42 09/29/2015    Dg Finger Ring Right  07/12/2014  CLINICAL DATA:  20 year old male with history of fracture of the right third finger 2 weeks ago. Followup study. EXAM: RIGHT RING FINGER 2+V COMPARISON:  No priors. FINDINGS: Three views of the right third finger demonstrate a fracture fragment off the volar aspect the base of the third middle phalanx, which is minimally (1.5 mm proximally) displaced. No other acute displaced fracture, subluxation or dislocation is noted. IMPRESSION: 1. Subacute minimally displaced volar plate avulsion fracture at the base of the third middle phalanx, as above. Electronically Signed   By: Trudie Reedaniel  Entrikin M.D.   On: 07/12/2014 15:09    Assessment & Plan:   Paul Myers was seen today for annual exam.  Diagnoses and all orders for this visit:  Angular cheilitis -     triamcinolone cream (KENALOG) 0.5 %; Apply  1 application topically 3 (three) times daily.  Hyperhidrosis of axilla -     aluminum chloride (DRYSOL) 20 % external solution; Apply topically 2 (two) times daily. As needed for excessive perspiration  Exposure to STD -     Urinalysis, Routine w reflex microscopic ([facility]); Future -     GC/chlamydia probe amp, urine; Future -     HIV antibody; Future -     HSV(herpes smplx)abs-1+2(IgG+IgM)-bld; Future -     RPR; Future  Routine general medical examination at a health care facility-Exam completed, labs ordered and reviewed, patient education material was given, vaccines were reviewed, he was advised regarding safe sexual practices. -     CBC with Differential/Platelet; Future -     Comprehensive metabolic panel; Future -     Lipid panel; Future -     TSH; Future   I am having Mr. Okey DupreRose start on triamcinolone cream. I am also having him maintain his benzoyl peroxide and aluminum chloride.  Meds ordered this encounter  Medications  . aluminum chloride (DRYSOL) 20 % external solution  Sig: Apply topically 2 (two) times daily. As needed for excessive perspiration    Dispense:  60 mL    Refill:  11  . triamcinolone cream (KENALOG) 0.5 %    Sig: Apply 1 application topically 3 (three) times daily.    Dispense:  30 g    Refill:  1     Follow-up: Return if symptoms worsen or fail to improve.  Sanda Linger, MD

## 2015-09-30 LAB — GC/CHLAMYDIA PROBE AMP
CT PROBE, AMP APTIMA: NOT DETECTED
GC PROBE AMP APTIMA: NOT DETECTED

## 2015-09-30 LAB — HIV ANTIBODY (ROUTINE TESTING W REFLEX): HIV: NONREACTIVE

## 2015-09-30 LAB — RPR

## 2015-10-02 ENCOUNTER — Encounter: Payer: Self-pay | Admitting: Internal Medicine

## 2015-10-04 ENCOUNTER — Encounter: Payer: No Typology Code available for payment source | Admitting: Internal Medicine

## 2015-10-04 ENCOUNTER — Encounter: Payer: Self-pay | Admitting: Internal Medicine

## 2015-10-04 LAB — HSV(HERPES SMPLX)ABS-I+II(IGG+IGM)-BLD
HSV 2 Glycoprotein G Ab, IgG: <0.9 Index (ref <0.90)
Herpes Simplex Vrs I&II-IgM Ab (EIA): 0.25 INDEX

## 2016-01-09 IMAGING — CR DG FINGER RING 2+V*R*
2 series · 2 of 2 positions shown · non-contrast
Comparison: No priors.

CLINICAL DATA: 19-year-old male with history of fracture of the
right third finger 2 weeks ago. Followup study.

EXAM:
RIGHT RING FINGER 2+V

[view not recorded (1 of 2)]
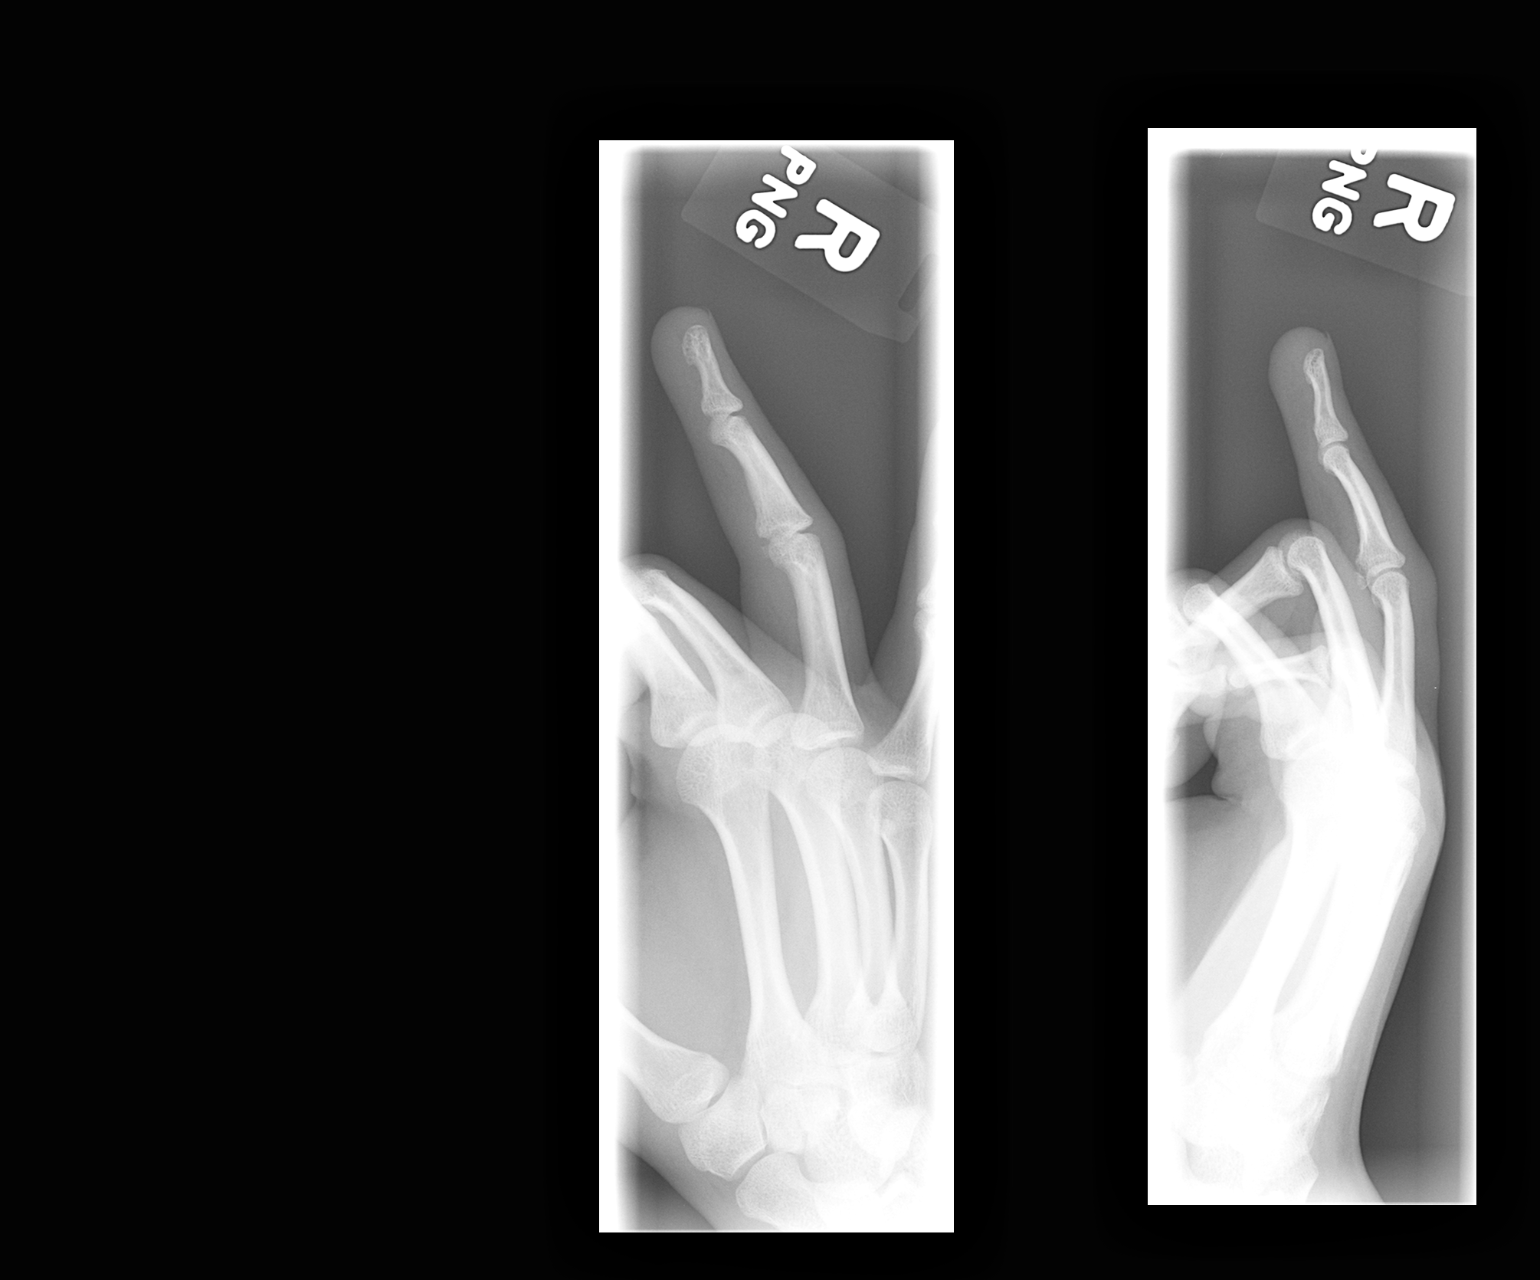

[view not recorded (2 of 2)]
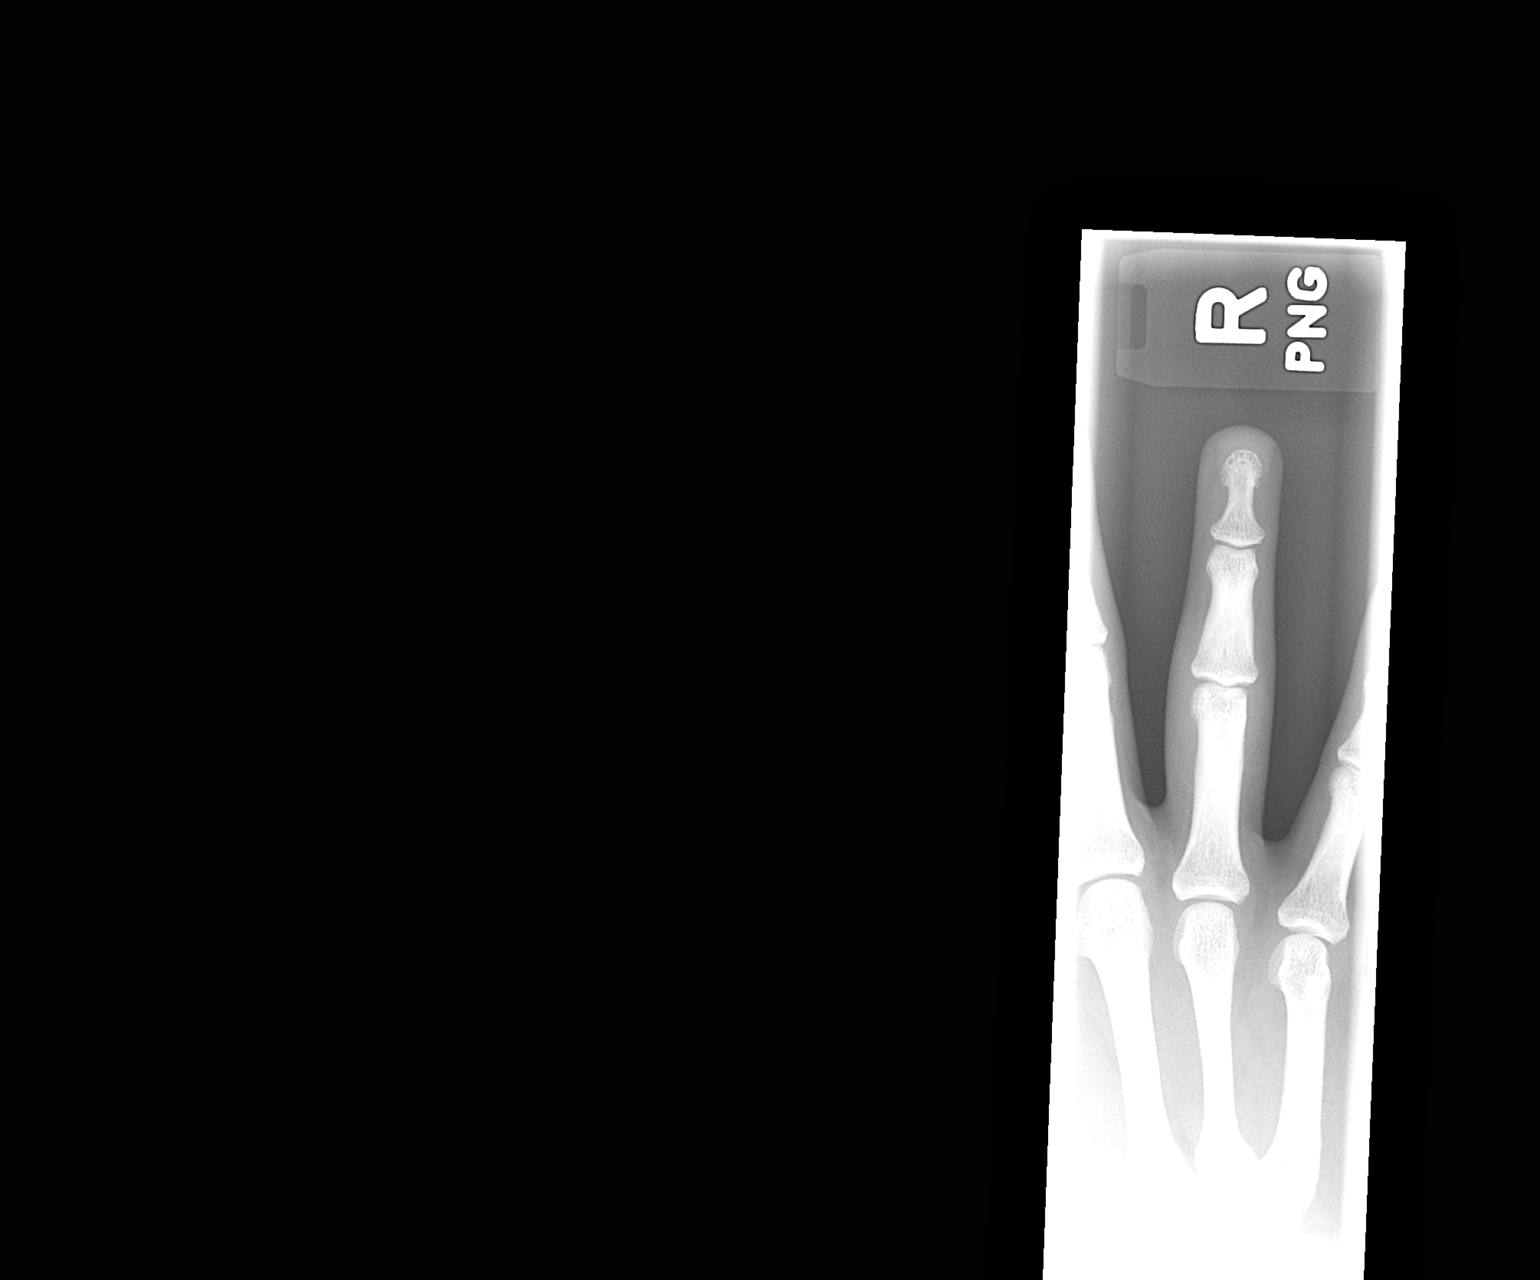

[2 of 2 positions shown; findings below may reference images not displayed]

FINDINGS: Three views of the right third finger demonstrate a fracture
fragment off the volar aspect the base of the third middle phalanx,
which is minimally (1.5 mm proximally) displaced. No other acute
displaced fracture, subluxation or dislocation is noted.
IMPRESSION: 1. Subacute minimally displaced volar plate avulsion fracture at the
base of the third middle phalanx, as above.
